# Patient Record
Sex: Female | Born: 1995 | Race: Black or African American | Hispanic: No | Marital: Single | Smoking: Never smoker
Health system: Southern US, Community
[De-identification: ages and names within clinical notes are randomized; demographics above are authoritative.]

## PROBLEM LIST (undated history)

## (undated) DIAGNOSIS — D649 Anemia, unspecified: Secondary | ICD-10-CM

## (undated) DIAGNOSIS — G43909 Migraine, unspecified, not intractable, without status migrainosus: Secondary | ICD-10-CM

## (undated) DIAGNOSIS — T7840XA Allergy, unspecified, initial encounter: Secondary | ICD-10-CM

## (undated) DIAGNOSIS — E236 Other disorders of pituitary gland: Secondary | ICD-10-CM

## (undated) HISTORY — DX: Anemia, unspecified: D64.9

## (undated) HISTORY — DX: Other disorders of pituitary gland: E23.6

## (undated) HISTORY — DX: Allergy, unspecified, initial encounter: T78.40XA

## (undated) HISTORY — DX: Migraine, unspecified, not intractable, without status migrainosus: G43.909

---
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
## 2016-05-20 ENCOUNTER — Encounter: Payer: Self-pay | Admitting: Family Medicine

## 2017-02-02 ENCOUNTER — Encounter: Payer: Self-pay | Admitting: Family Medicine

## 2018-07-07 IMAGING — DX DG CHEST 2V
2 series · 2 of 2 positions shown · non-contrast
Comparison: CT scan of the head neck October 30, 2015

CLINICAL DATA: Follow-up of upper lung nodules; no current
complaints; nonsmoker.

EXAM:
CHEST  2 VIEW

[chest pa]
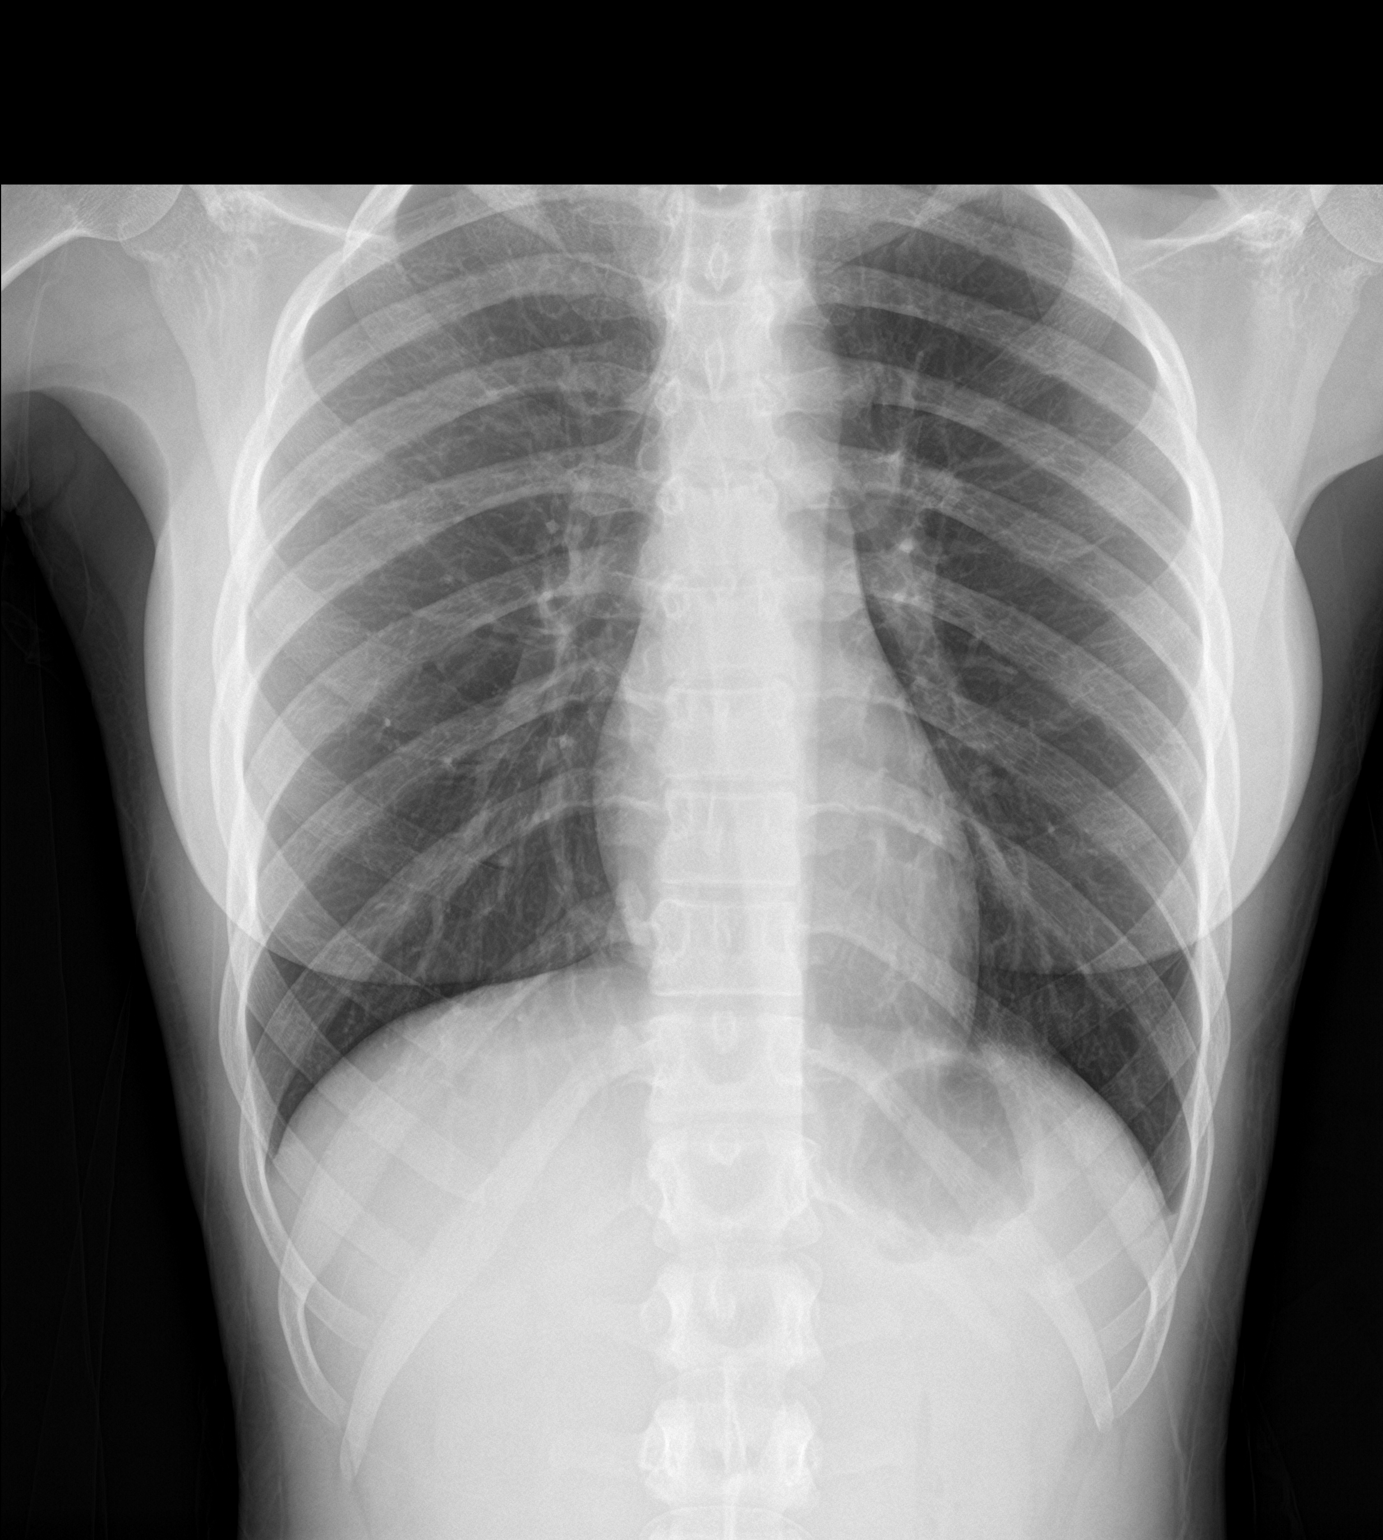

[chest lat]
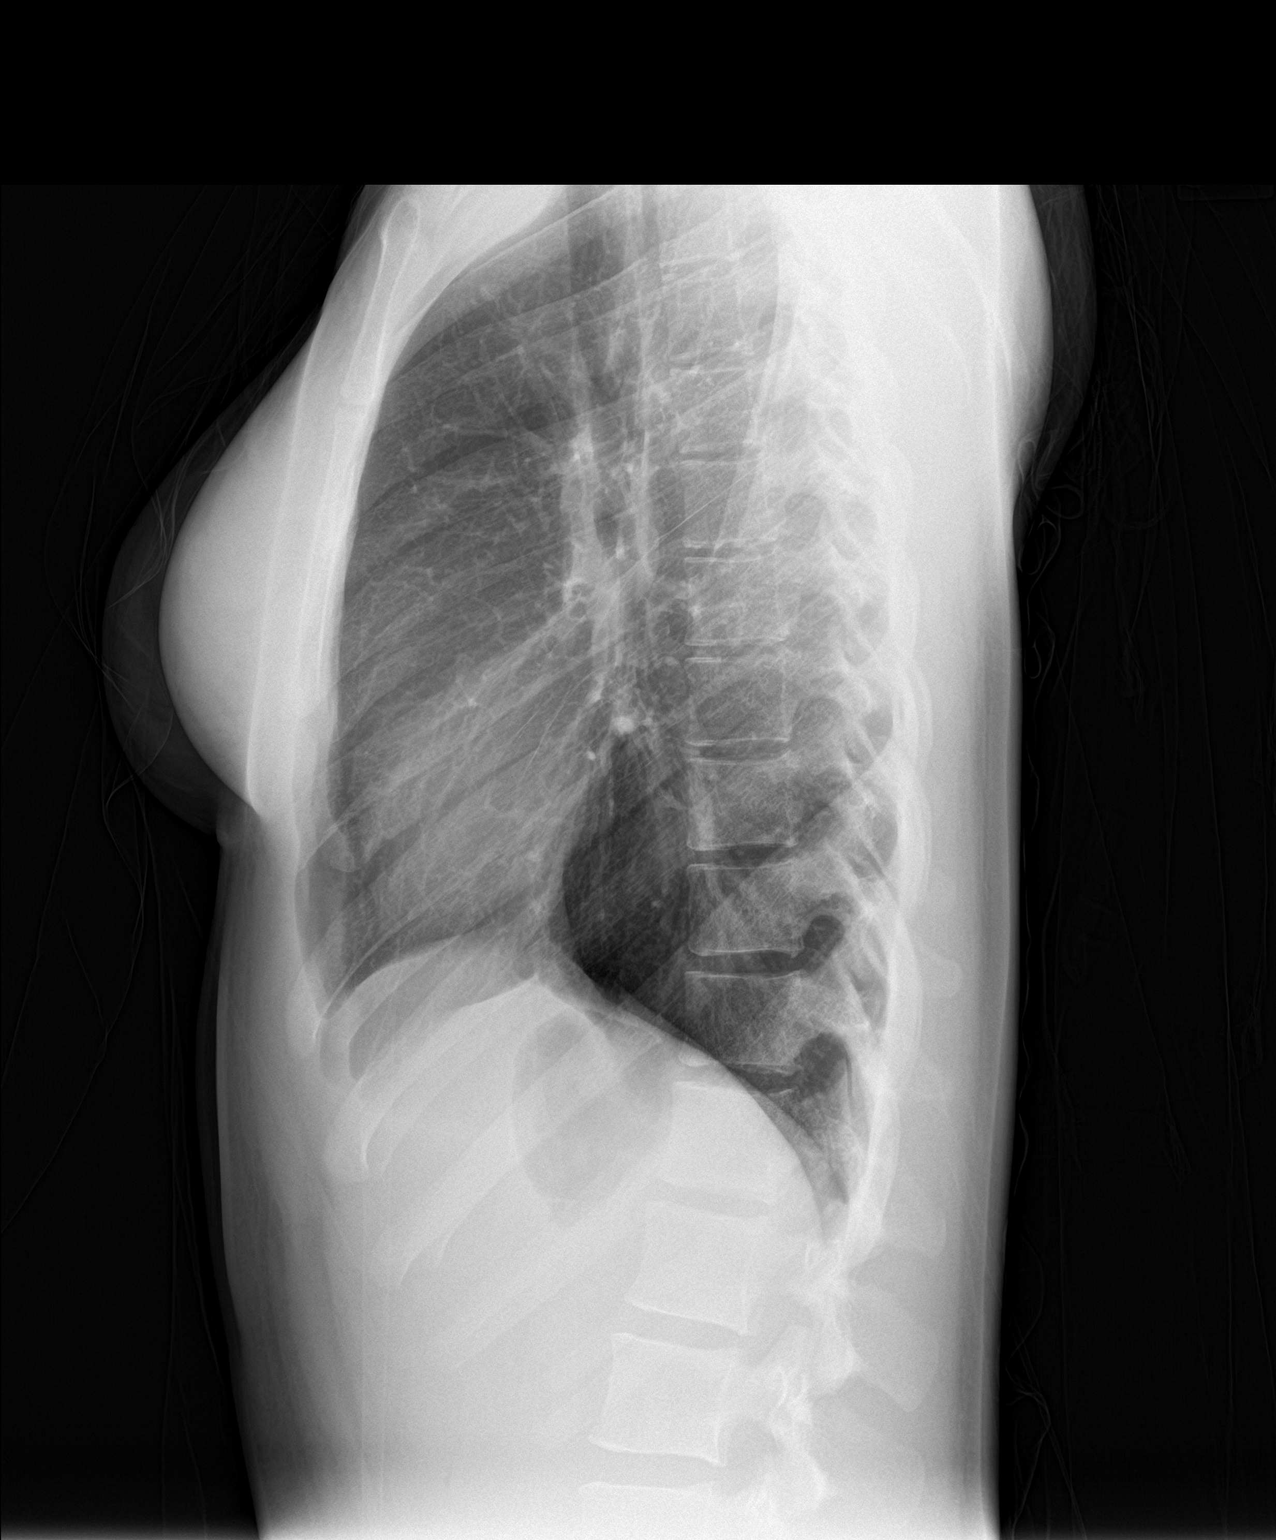

[2 of 2 positions shown; findings below may reference images not displayed]

FINDINGS: The lungs are mildly hyperinflated and clear. There is subtle
nodularity noted in the anterior aspect of the upper lung field on
the lateral view at likely corresponds to the left upper lobe nodule
demonstrated on the CT scan. It is not clearly evident on the
frontal radiograph today. The heart and mediastinal structures are
normal. There is no pleural effusion. The bony thorax is
unremarkable.
IMPRESSION: Subtle approximately 6-7 mm diameter nodule present in the retro
manubrial soft tissues likely corresponding to the CT finding.
Otherwise no abnormalities are observed within the thorax.

## 2019-05-09 ENCOUNTER — Ambulatory Visit: Payer: BC Managed Care – PPO | Attending: Internal Medicine

## 2019-05-09 DIAGNOSIS — U071 COVID-19: Secondary | ICD-10-CM

## 2019-05-11 LAB — NOVEL CORONAVIRUS, NAA: SARS-CoV-2, NAA: NOT DETECTED

## 2023-11-01 ENCOUNTER — Ambulatory Visit
Admission: RE | Admit: 2023-11-01 | Discharge: 2023-11-01 | Disposition: A | Discharge status: Home / Self Care | Source: Ambulatory Visit | Attending: Emergency Medicine | Admitting: Emergency Medicine

## 2023-11-01 ENCOUNTER — Other Ambulatory Visit: Payer: Self-pay

## 2023-11-01 VITALS — BP 112/73 | HR 75 | Temp 99.0°F | Resp 16

## 2023-11-01 DIAGNOSIS — R21 Rash and other nonspecific skin eruption: Secondary | ICD-10-CM

## 2023-11-01 DIAGNOSIS — L249 Irritant contact dermatitis, unspecified cause: Secondary | ICD-10-CM

## 2023-11-01 MED ORDER — PREDNISONE 10 MG (21) PO TBPK: ORAL_TABLET | Freq: Every day | ORAL | 0 refills | Status: AC

## 2023-11-01 NOTE — Discharge Instructions (Signed)
 Rash appears more of a contact dermatitis possible poison ivy. Continue to use hydrocortisone cream and calamine several times a day  Wash dry area well  Take Benadryl as needed for itching This can take several days to go away

## 2023-11-01 NOTE — ED Provider Notes (Signed)
 [facility]    CSN: [id number] Arrival date & time: 11/01/23  1028      History   Chief Complaint Chief Complaint  Patient presents with   Rash    HPI Diana Church is a 28 y.o. female.   Patient presents with a rash to the bilateral upper arms for approximately 4 days.  Patient states her boyfriend has a similar rash that began on Friday and she noticed it on Saturday.  Boyfriend does work outside [employer].  Patient has been using hydrocortisone cream and calamine which has helped some.  Has not used any new lotions or soaps Denies any blisters or drainage.     Past Medical History:  Diagnosis Date   Allergy    Anemia    referred to hematology at [facility], iron def dx with referral to GI per heme notes   Empty sella (HCC)    referred to endo   Migraines    abnormal MRI, ok per [facility] at [facility], referred to endo for empty sella per [facility] recs    Patient Active Problem List   Diagnosis Date Noted   Chronic headaches 04/22/2014   Allergic rhinitis 04/22/2014    History reviewed. No pertinent surgical history.  OB History   No obstetric history on file.      Home Medications    Prior to Admission medications   Medication Sig Start Date End Date Taking? Authorizing Provider  predniSONE (STERAPRED UNI-PAK 21 TAB) 10 MG (21) TBPK tablet Take by mouth daily. Take 6 tabs by mouth daily  for 2 days, then 5 tabs for 2 days, then 4 tabs for 2 days, then 3 tabs for 2 days, 2 tabs for 2 days, then 1 tab by mouth daily for 2 days 11/01/23  Yes Harden Leyden, NP    Family History Family History  Problem Relation Age of Onset   Migraines Father     Social History Social History   Tobacco Use   Smoking status: Never   Smokeless tobacco: Never  Substance Use Topics   Alcohol use: Yes    Comment: occ   Drug use: No     Allergies   Patient has no known allergies.   Review of Systems Review of Systems  Constitutional:  Negative for fever.   Respiratory: Negative.    Cardiovascular: Negative.   Gastrointestinal: Negative.   Genitourinary: Negative.   Skin:  Positive for rash.     Physical Exam Triage Vital Signs ED Triage Vitals  Encounter Vitals Group     BP 11/01/23 1050 112/73     Girls Systolic BP Percentile --      Girls Diastolic BP Percentile --      Boys Systolic BP Percentile --      Boys Diastolic BP Percentile --      Pulse Rate 11/01/23 1050 75     Resp 11/01/23 1050 16     Temp 11/01/23 1050 99 F (37.2 C)     Temp Source 11/01/23 1050 Oral     SpO2 11/01/23 1050 97 %     Weight --      Height --      Head Circumference --      Peak Flow --      Pain Score 11/01/23 1048 0     Pain Loc --      Pain Education --      Exclude from Growth Chart --    No data found.  Updated Vital Signs BP 112/73   Pulse 75   Temp 99 F (37.2 C) (Oral)   Resp 16   LMP 10/03/2023   SpO2 97%   Visual Acuity Right Eye Distance:   Left Eye Distance:   Bilateral Distance:    Right Eye Near:   Left Eye Near:    Bilateral Near:     Physical Exam Constitutional:      Appearance: Normal appearance.   Cardiovascular:     Rate and Rhythm: Normal rate.  Pulmonary:     Effort: Pulmonary effort is normal.  Abdominal:     General: Abdomen is flat.   Skin:    Findings: Rash present.     Comments: Raised itchy rash noted to bilateral forearms slightly blister appearance.  No drainage.   Neurological:     Mental Status: She is alert.      UC Treatments / Results  Labs (all labs ordered are listed, but only abnormal results are displayed) Labs Reviewed - No data to display  EKG   Radiology No results found.  Procedures Procedures (including critical care time)  Medications Ordered in UC Medications - No data to display  Initial Impression / Assessment and Plan / UC Course  I have reviewed the triage vital signs and the nursing notes.  Pertinent labs & imaging results that were available  during my care of the patient were reviewed by me and considered in my medical decision making (see chart for details).     Rash appears more of a contact dermatitis possible poison ivy. Continue to use hydrocortisone cream and calamine several times a day  Wash dry area well  Take Benadryl as needed for itching This can take several days to go away  Final Clinical Impressions(s) / UC Diagnoses   Final diagnoses:  Irritant contact dermatitis, unspecified trigger  Rash and nonspecific skin eruption     Discharge Instructions      Rash appears more of a contact dermatitis possible poison ivy. Continue to use hydrocortisone cream and calamine several times a day  Wash dry area well  Take Benadryl as needed for itching This can take several days to go away     ED Prescriptions     Medication Sig Dispense Auth. Provider   predniSONE (STERAPRED UNI-PAK 21 TAB) 10 MG (21) TBPK tablet Take by mouth daily. Take 6 tabs by mouth daily  for 2 days, then 5 tabs for 2 days, then 4 tabs for 2 days, then 3 tabs for 2 days, 2 tabs for 2 days, then 1 tab by mouth daily for 2 days 42 tablet Harden Leyden, NP      PDMP not reviewed this encounter.   Harden Leyden, NP 11/01/23 1145

## 2023-11-01 NOTE — ED Triage Notes (Signed)
 Pt c/o rash on arms bilatx4d. Pt has raised red bumps in lines on left upper arm. Pt states it does itch. Pt states has been using calamine lotion. PT states boyfriend had the rash first then she got it. Pt states the calamine lotion isn't helping.
